# Patient Record
Sex: Female | Born: 1945 | Race: Black or African American | Hispanic: No | Marital: Single | Smoking: Never smoker
Health system: Southern US, Community
[De-identification: ages and names within clinical notes are randomized; demographics above are authoritative.]

## PROBLEM LIST (undated history)

## (undated) DIAGNOSIS — E119 Type 2 diabetes mellitus without complications: Secondary | ICD-10-CM

## (undated) DIAGNOSIS — I10 Essential (primary) hypertension: Secondary | ICD-10-CM

---

## 1997-06-09 ENCOUNTER — Other Ambulatory Visit: Admission: RE | Admit: 1997-06-09 | Discharge: 1997-06-09 | Payer: Self-pay | Admitting: Obstetrics & Gynecology

## 1998-06-29 ENCOUNTER — Other Ambulatory Visit: Admission: RE | Admit: 1998-06-29 | Discharge: 1998-06-29 | Payer: Self-pay | Admitting: Obstetrics & Gynecology

## 2002-04-05 ENCOUNTER — Other Ambulatory Visit: Admission: RE | Admit: 2002-04-05 | Discharge: 2002-04-05 | Payer: Self-pay | Admitting: Obstetrics & Gynecology

## 2002-05-20 ENCOUNTER — Ambulatory Visit (HOSPITAL_COMMUNITY): Admission: RE | Admit: 2002-05-20 | Discharge: 2002-05-20 | Payer: Self-pay | Admitting: Internal Medicine

## 2003-04-14 ENCOUNTER — Other Ambulatory Visit: Admission: RE | Admit: 2003-04-14 | Discharge: 2003-04-14 | Payer: Self-pay | Admitting: Obstetrics & Gynecology

## 2004-10-01 ENCOUNTER — Other Ambulatory Visit: Admission: RE | Admit: 2004-10-01 | Discharge: 2004-10-01 | Payer: Self-pay | Admitting: Obstetrics & Gynecology

## 2010-03-28 ENCOUNTER — Other Ambulatory Visit: Payer: Self-pay | Admitting: Obstetrics & Gynecology

## 2010-03-28 DIAGNOSIS — N6310 Unspecified lump in the right breast, unspecified quadrant: Secondary | ICD-10-CM

## 2010-04-03 ENCOUNTER — Ambulatory Visit
Admission: RE | Admit: 2010-04-03 | Discharge: 2010-04-03 | Disposition: A | Discharge status: Home / Self Care | Payer: BC Managed Care – PPO | Source: Ambulatory Visit | Attending: Obstetrics & Gynecology | Admitting: Obstetrics & Gynecology

## 2010-04-03 ENCOUNTER — Other Ambulatory Visit: Payer: Self-pay | Admitting: Obstetrics & Gynecology

## 2010-04-03 DIAGNOSIS — N6320 Unspecified lump in the left breast, unspecified quadrant: Secondary | ICD-10-CM

## 2010-04-03 DIAGNOSIS — N6310 Unspecified lump in the right breast, unspecified quadrant: Secondary | ICD-10-CM

## 2010-06-21 NOTE — Op Note (Signed)
[name], [name]                            ACCOUNT NO.:  [id number]   MEDICAL RECORD NO.:  [id number]                   PATIENT TYPE:  AMB   LOCATION:  DAY                                  FACILITY:   PHYSICIAN:  Lionel December, M.D.                 DATE OF BIRTH:  29-Mar-1945   DATE OF PROCEDURE:  05/20/2002  DATE OF DISCHARGE:                                 OPERATIVE REPORT   PROCEDURE:  Esophagogastroduodenoscopy followed by total colonoscopy.   INDICATION:  [name] is a 65 year old African-American female with a several  year history of symptoms of GERD which do respond to PPI.  She is undergoing  EGD to make sure she does not have any complications of chronic GERD.  She  will also undergo screening colonoscopy.  She is average risk for CRC.  Both  procedures were reviewed with the patient and informed consent was obtained.   PREOP MEDICATION:  1. Cetacaine spray for pharyngeal topical anesthesia.  2. Demerol 50 mg IV.  3. Versed 5 mg IV in divided dose.   INSTRUMENT:  Olympus video system.   FINDINGS:  Procedure performed in the endoscopy suite.  The patient's vital  signs and O2 sat were monitored during the procedure and remained stable.   1. ESOPHAGOGASTRODUODENOSCOPY:  The patient was placed in the left lateral  recumbent position and endoscope was passed via the oropharynx without any  difficulty into the esophagus.  Mucosa of the esophagus was normal  throughout.  Squamocolumnar junction was unremarkable.  No hernia was noted.   Stomach:  It was empty and distended very well on insufflation.  Folds in  the proximal stomach were normal.  Examination of the mucosa, gastric body,  antrum, pyloric channel, as well as annularis, fundus, cardia was normal.   Duodenum:  Examination of the bulb and post-bulbar duodenum was normal.  The endoscope was withdrawn and the patient was prepared for procedure #2.   2. TOTAL COLONOSCOPY:  Rectal examination performed.  No  abnormality noted  on the external or digital exam.  Scope was placed in the rectum and  advanced to the sigmoid colon and beyond.  Preparation was excellent.  Scope  was passed to the cecum which was identified by ileocecal valve and  appendiceal orifice.  Pictures were taken for the record.  As the scope was  withdrawn, colonic mucosa was carefully examined.  It was normal throughout.  There was a small or tiny polyp at rectum which was obliterated by cold  biopsy.  Rectal mucosa was normal.  Scope was retroflexed, examining  anorectal junction and hemorrhoids were noted below the dentate line.  Endoscope was straightened and withdrawn. The patient tolerated the  procedure well.   FINAL DIAGNOSES:  1. Normal esophagogastroduodenoscopy.  2. Small rectal polyp and external hemorrhoids; otherwise normal     colonoscopy. This polyp was obliterated by cold biopsy.  RECOMMENDATIONS:  1. She will continue antireflux measures and she can take Protonix daily on     demand.  P.r.n. use would be appropriate. She does not have any evidence     of Barrett's esophagus or endoscopic esophagitis.  2. I will contact the patient with the biopsies next week.                                                Lionel December, M.D.    NR/MEDQ  D:  05/20/2002  T:  05/20/2002  Job:  [id number]

## 2010-06-21 NOTE — H&P (Signed)
NAMECRISTIANA, [name]                          ACCOUNT NO.:  [id number]   MEDICAL RECORD NO.:  [id number]                  PATIENT TYPE:   LOCATION:                                       FACILITY:   PHYSICIAN:  Lionel December, M.D.                 DATE OF BIRTH:  09/21/1945   DATE OF ADMISSION:  04/25/2002  DATE OF DISCHARGE:                                HISTORY & PHYSICAL   PRESENTING COMPLAINT:  Recurrent symptoms of GERD.   HISTORY OF PRESENT ILLNESS:  The patient is a 65 year old African-American  female who has had symptoms of reflux esophagitis for at least five years.  She was initially seen in February 1999, and two months later again.  She  was begun on antireflux measures and Prevacid.  She did great for awhile.  She began to have breakthrough symptoms last year in spite of continuing  with antireflux measures.  She was seen by Ms. Wille Celeste, NP, at  [facility] about two weeks ago.  She was switched to Protonix 40 mg  q.m. was made.  She has noted significant improvement since she has been on  Protonix.  She is not having any heartburn, regurgitation, or pyrosis.  She  also denies dysphagia, nausea, vomiting, anorexia, or weight loss.  She has  a good appetite.  Review of systems negative for melena or rectal bleeding.  She has never been screened for colorectal carcinoma.   PAST MEDICAL HISTORY:  1. Hypertension for over 20 years.  2. Tubal ligation.  3. Skin grafting to her right index finger a few years ago.   ALLERGIES:  None known.   FAMILY HISTORY:  Mother has diabetes and hypertension.  Father was also  diabetic and hypertensive and died at age 57, about three years ago.  She  has seven sisters, and three are hypertensive.   SOCIAL HISTORY:  She is single and has two children.  She has been working  at a tobacco company for 27 years, which was initially [employer] and now is  [employer].  She does not smoke cigarettes or drink alcohol.   PHYSICAL EXAMINATION:  GENERAL:  Pleasant, well-developed, well-nourished  African-American female who is in no acute distress.  VITAL SIGNS:  She weighs 177 pounds.  She is 5 feet 8 inches tall.  Pulse 76  per minute, blood pressure 140/70.  HEENT:  Conjunctivae pink.  Sclerae nonicteric.  Oropharyngeal mucosa is  normal.  NECK:  Without masses or thyromegaly.  CARDIAC:  Regular rhythm.  Normal S1 and S2.  No murmur or gallop noted.  LUNGS:  Clear to auscultation.  ABDOMEN:  Full, soft, nontender.  Without organomegaly or masses.  RECTAL:  Deferred.  EXTREMITIES:  No clubbing or peripheral edema noted.   ASSESSMENT:  1. The patient is a 65 year old African-American female who has had symptoms     of reflux esophagitis for at  least five years.  It is curious to know to     know that she developed intolerance to Prevacid which worked quite well     for a number of years; however, she has had a good response to Protonix.     She appears to be adhering to antireflux measures fairly well.  She     actually has lost eight pounds since she was last in our office.  Since     she has chronic gastroesophageal reflux disease, upper gastrointestinal     tract needs to be evaluated to make sure she does not have Barrett's     esophagus.  2. She is average risk for colorectal carcinoma.  She has never been     screened for colorectal carcinoma.  It was reviewed with the patient, and     she is very much interested in also having her colon checked.   RECOMMENDATIONS:  She will continue antireflux measures as before.  She will  stay on Protonix at 40 mg p.o. q.m., prescription given for 30 with 11  refills.   She will undergo diagnostic esophagogastroduodenoscopy, followed by  screening colonoscopy.  I have reviewed the procedures with the patient, and  she is agreeable.   I would like to thank Ms. Wille Celeste and Dr. Nobie Putnam for the  opportunity to participate in the care of this nice  lady.                                               Lionel December, M.D.    NR/MEDQ  D:  04/25/2002  T:  04/25/2002  Job:  [id number]   cc:   Wille Celeste, N.P.   Patrica Duel, M.D.  [address]  [place]  [place] [address]  Fax: [phone]   [facility]

## 2012-05-24 ENCOUNTER — Telehealth: Payer: Self-pay

## 2012-05-24 NOTE — Telephone Encounter (Signed)
Pt was referred by Dr. Sherwood Gambler for screening colonoscopy. Her last one ( EGD/TCS) was 05/20/2002 with Dr. Karilyn Cota. She is not having problems now and has not decided to do colonoscopy. She is aware now that N[facility] is across from the hospital. She will decide what she wants to do, and give [place] a call.

## 2012-05-26 NOTE — Telephone Encounter (Signed)
Letter to PCP

## 2013-01-13 ENCOUNTER — Other Ambulatory Visit (HOSPITAL_COMMUNITY): Payer: Self-pay | Admitting: Family Medicine

## 2013-01-13 DIAGNOSIS — R7989 Other specified abnormal findings of blood chemistry: Secondary | ICD-10-CM

## 2013-01-19 ENCOUNTER — Ambulatory Visit (HOSPITAL_COMMUNITY)
Admission: RE | Admit: 2013-01-19 | Discharge: 2013-01-19 | Disposition: A | Discharge status: Home / Self Care | Payer: Medicare Other | Source: Ambulatory Visit | Attending: Family Medicine | Admitting: Family Medicine

## 2013-01-19 ENCOUNTER — Other Ambulatory Visit (HOSPITAL_COMMUNITY): Payer: Self-pay | Admitting: Family Medicine

## 2013-01-19 DIAGNOSIS — K7689 Other specified diseases of liver: Secondary | ICD-10-CM | POA: Insufficient documentation

## 2013-01-19 DIAGNOSIS — K8020 Calculus of gallbladder without cholecystitis without obstruction: Secondary | ICD-10-CM | POA: Insufficient documentation

## 2013-01-19 DIAGNOSIS — R7989 Other specified abnormal findings of blood chemistry: Secondary | ICD-10-CM | POA: Insufficient documentation

## 2013-08-01 ENCOUNTER — Emergency Department (HOSPITAL_COMMUNITY)
Admission: EM | Admit: 2013-08-01 | Discharge: 2013-08-02 | Disposition: A | Discharge status: Home / Self Care | Payer: Medicare Other | Attending: Emergency Medicine | Admitting: Emergency Medicine

## 2013-08-01 ENCOUNTER — Emergency Department (HOSPITAL_COMMUNITY): Payer: Medicare Other

## 2013-08-01 ENCOUNTER — Encounter (HOSPITAL_COMMUNITY): Payer: Self-pay | Admitting: Emergency Medicine

## 2013-08-01 DIAGNOSIS — E119 Type 2 diabetes mellitus without complications: Secondary | ICD-10-CM | POA: Insufficient documentation

## 2013-08-01 DIAGNOSIS — Y92009 Unspecified place in unspecified non-institutional (private) residence as the place of occurrence of the external cause: Secondary | ICD-10-CM | POA: Insufficient documentation

## 2013-08-01 DIAGNOSIS — T675XXA Heat exhaustion, unspecified, initial encounter: Secondary | ICD-10-CM | POA: Insufficient documentation

## 2013-08-01 DIAGNOSIS — Y9389 Activity, other specified: Secondary | ICD-10-CM | POA: Insufficient documentation

## 2013-08-01 DIAGNOSIS — R55 Syncope and collapse: Secondary | ICD-10-CM

## 2013-08-01 DIAGNOSIS — E860 Dehydration: Secondary | ICD-10-CM | POA: Insufficient documentation

## 2013-08-01 DIAGNOSIS — I10 Essential (primary) hypertension: Secondary | ICD-10-CM | POA: Insufficient documentation

## 2013-08-01 DIAGNOSIS — X30XXXA Exposure to excessive natural heat, initial encounter: Secondary | ICD-10-CM | POA: Insufficient documentation

## 2013-08-01 DIAGNOSIS — Z79899 Other long term (current) drug therapy: Secondary | ICD-10-CM | POA: Insufficient documentation

## 2013-08-01 HISTORY — DX: Essential (primary) hypertension: I10

## 2013-08-01 HISTORY — DX: Type 2 diabetes mellitus without complications: E11.9

## 2013-08-01 LAB — BASIC METABOLIC PANEL
BUN: 14 mg/dL (ref 6–23)
CO2: 28 meq/L (ref 19–32)
CREATININE: 1.24 mg/dL — AB (ref 0.50–1.10)
Calcium: 9.9 mg/dL (ref 8.4–10.5)
Chloride: 100 mEq/L (ref 96–112)
GFR calc non Af Amer: 44 mL/min — ABNORMAL LOW (ref >90)
GFR, EST AFRICAN AMERICAN: 51 mL/min — AB (ref >90)
Glucose, Bld: 157 mg/dL — ABNORMAL HIGH (ref 70–99)
Potassium: 3.8 mEq/L (ref 3.7–5.3)
Sodium: 143 mEq/L (ref 137–147)

## 2013-08-01 LAB — I-STAT TROPONIN, ED: TROPONIN I, POC: 0 ng/mL (ref 0.00–0.08)

## 2013-08-01 LAB — CBC
HCT: 37.9 % (ref 36.0–46.0)
Hemoglobin: 12.8 g/dL (ref 12.0–15.0)
MCH: 28.3 pg (ref 26.0–34.0)
MCHC: 33.8 g/dL (ref 30.0–36.0)
MCV: 83.7 fL (ref 78.0–100.0)
PLATELETS: 348 10*3/uL (ref 150–400)
RBC: 4.53 MIL/uL (ref 3.87–5.11)
RDW: 14.2 % (ref 11.5–15.5)
WBC: 12.6 10*3/uL — ABNORMAL HIGH (ref 4.0–10.5)

## 2013-08-01 NOTE — ED Notes (Signed)
Patient is actively vomiting in triage. °

## 2013-08-01 NOTE — ED Notes (Signed)
Patient was mowing yard and was stung by a yellow jacket on left calf and right hand.  No hives or swelling noted to any of the areas.  Patient states she had a syncopal episode after being stung and vomited x1.

## 2013-08-01 NOTE — Discharge Instructions (Signed)
Syncope °Syncope is a medical term for fainting or passing out. This means you lose consciousness and drop to the ground. People are generally unconscious for less than 5 minutes. You may have some muscle twitches for up to 15 seconds before waking up and returning to normal. Syncope occurs more often in older adults, but it can happen to anyone. While most causes of syncope are not dangerous, syncope can be a sign of a serious medical problem. It is important to seek medical care.  °CAUSES  °Syncope is caused by a sudden drop in blood flow to the brain. The specific cause is often not determined. Factors that can bring on syncope include: °· Taking medicines that lower blood pressure. °· Sudden changes in posture, such as standing up quickly. °· Taking more medicine than prescribed. °· Standing in one place for too long. °· Seizure disorders. °· Dehydration and excessive exposure to heat. °· Low blood sugar (hypoglycemia). °· Straining to have a bowel movement. °· Heart disease, irregular heartbeat, or other circulatory problems. °· Fear, emotional distress, seeing blood, or severe pain. °SYMPTOMS  °Right before fainting, you may: °· Feel dizzy or light-headed. °· Feel nauseous. °· See all white or all black in your field of vision. °· Have cold, clammy skin. °DIAGNOSIS  °Your health care provider will ask about your symptoms, perform a physical exam, and perform an electrocardiogram (ECG) to record the electrical activity of your heart. Your health care provider may also perform other heart or blood tests to determine the cause of your syncope which may include: °· Transthoracic echocardiogram (TTE). During echocardiography, sound waves are used to evaluate how blood flows through your heart. °· Transesophageal echocardiogram (TEE). °· Cardiac monitoring. This allows your health care provider to monitor your heart rate and rhythm in real time. °· Holter monitor. This is a portable device that records your  heartbeat and can help diagnose heart arrhythmias. It allows your health care provider to track your heart activity for several days, if needed. °· Stress tests by exercise or by giving medicine that makes the heart beat faster. °TREATMENT  °In most cases, no treatment is needed. Depending on the cause of your syncope, your health care provider may recommend changing or stopping some of your medicines. °HOME CARE INSTRUCTIONS °· Have someone stay with you until you feel stable. °· Do not drive, use machinery, or play sports until your health care provider says it is okay. °· Keep all follow-up appointments as directed by your health care provider. °· Lie down right away if you start feeling like you might faint. Breathe deeply and steadily. Wait until all the symptoms have passed. °· Drink enough fluids to keep your urine clear or pale yellow. °· If you are taking blood pressure or heart medicine, get up slowly and take several minutes to sit and then stand. This can reduce dizziness. °SEEK IMMEDIATE MEDICAL CARE IF:  °· You have a severe headache. °· You have unusual pain in the chest, abdomen, or back. °· You are bleeding from your mouth or rectum, or you have black or tarry stool. °· You have an irregular or very fast heartbeat. °· You have pain with breathing. °· You have repeated fainting or seizure-like jerking during an episode. °· You faint when sitting or lying down. °· You have confusion. °· You have trouble walking. °· You have severe weakness. °· You have vision problems. °If you fainted, call your local emergency services (911 in [place] Do not drive   yourself to the hospital.  °MAKE SURE YOU: °· Understand these instructions. °· Will watch your condition. °· Will get help right away if you are not doing well or get worse. °Document Released: 01/20/2005 Document Revised: 01/25/2013 Document Reviewed: 03/21/2011 °ExitCare® Patient Information ©2015 [employer]. This information is not intended to replace  advice given to you by your health care provider. Make sure you discuss any questions you have with your health care provider. ° °

## 2013-08-01 NOTE — ED Provider Notes (Signed)
CSN: [id number]     Arrival date & time 08/01/13  2114 History   First MD Initiated Contact with Patient 08/01/13 2317     Chief Complaint  Patient presents with  . Insect Bite  . Loss of Consciousness     (Consider location/radiation/quality/duration/timing/severity/associated sxs/prior Treatment) HPI  This is a 68 year old female with history of diabetes and hypertension who presents following a syncopal episode. Patient states that she was mowing her lawn earlier this evening. She was stung in the right hand and the left foot by yellow jackets. She continued to move her lawn. Upon returning to her house, patient began to feel flushed, sat down, and "passed out." She did not hit her. She denies any shortness of breath or chest pain during this time. Reports one episode of emesis.  She denies any difficulty breathing. Patient has no known allergies.  Patient reports that it was hot outside and she "only ate 4 crackers earlier today." Blood glucose was 160.  Patient currently states that she feels well and is her baseline.  Past Medical History  Diagnosis Date  . Diabetes mellitus without complication   . Hypertension    History reviewed. No pertinent past surgical history. No family history on file. History  Substance Use Topics  . Smoking status: Never Smoker   . Smokeless tobacco: Not on file  . Alcohol Use: No   OB History   Grav Para Term Preterm Abortions TAB SAB Ect Mult Living                 Review of Systems  Constitutional: Negative for fever.  Respiratory: Negative for cough, chest tightness and shortness of breath.   Cardiovascular: Negative for chest pain.  Gastrointestinal: Negative for nausea, vomiting and abdominal pain.  Genitourinary: Negative for dysuria.  Musculoskeletal: Negative for back pain.  Skin: Negative for wound.  Neurological: Positive for syncope and light-headedness. Negative for headaches.  Psychiatric/Behavioral: Negative for confusion.   All other systems reviewed and are negative.     Allergies  Review of patient's allergies indicates no known allergies.  Home Medications   Prior to Admission medications   Medication Sig Start Date End Date Taking? Authorizing Provider  amLODipine (NORVASC) 10 MG tablet Take 20 mg by mouth daily.   Yes Historical Provider, MD  benazepril (LOTENSIN) 10 MG tablet Take 10 mg by mouth daily.   Yes Historical Provider, MD  hydrochlorothiazide (HYDRODIURIL) 25 MG tablet Take 25 mg by mouth daily.   Yes Historical Provider, MD   BP 127/71  Pulse 76  Temp(Src) 97.8 F (36.6 C) (Oral)  Resp 15  Ht 5\' 8"  (1.727 m)  Wt 172 lb (78.019 kg)  BMI 26.16 kg/m2  SpO2 98% Physical Exam  Nursing note and vitals reviewed. Constitutional: She is oriented to person, place, and time. She appears well-developed and well-nourished. No distress.  HENT:  Head: Normocephalic and atraumatic.  Mouth/Throat: Oropharynx is clear and moist.  Eyes: Pupils are equal, round, and reactive to light.  Neck: Neck supple.  Cardiovascular: Normal rate, regular rhythm and normal heart sounds.   No murmur heard. Pulmonary/Chest: Effort normal and breath sounds normal. No respiratory distress. She has no wheezes.  Abdominal: Soft. There is no tenderness.  Musculoskeletal: She exhibits no edema.  Neurological: She is alert and oriented to person, place, and time.  Skin: Skin is warm and dry.  No evidence of wound or insect bite  Psychiatric: She has a normal mood and affect.  ED Course  Procedures (including critical care time) Labs Review Labs Reviewed  CBC - Abnormal; Notable for the following:    WBC 12.6 (*)    All other components within normal limits  BASIC METABOLIC PANEL - Abnormal; Notable for the following:    Glucose, Bld 157 (*)    Creatinine, Ser 1.24 (*)    GFR calc non Af Amer 44 (*)    GFR calc Af Amer 51 (*)    All other components within normal limits  Rosezena SensorI-STAT TROPOININ, ED     Imaging Review Dg Chest 2 View  08/01/2013   CLINICAL DATA:  Stone by bee.  Passed out.  EXAM: CHEST  2 VIEW  COMPARISON:  None.  FINDINGS: Normal heart size and mediastinal contours. Fine interstitial opacities in the lower lungs. No edema, consolidation, effusion, or pneumothorax.  IMPRESSION: Mild bibasilar atelectasis or scar.   Electronically Signed   By: Tiburcio PeaJonathan  Watts M.D.   On: 08/01/2013 22:57     EKG Interpretation   Date/Time:  Monday August 01 2013 21:36:28 EDT Ventricular Rate:  78 PR Interval:  198 QRS Duration: 76 QT Interval:  374 QTC Calculation: 426 R Axis:   -63 Text Interpretation:  Normal sinus rhythm Possible Left atrial enlargement  Left axis deviation Inferior infarct , age undetermined Anterior infarct ,  age undetermined Abnormal ECG No prior for comparison Confirmed by Candiss Galeana   MD, Toni AmendOURTNEY ([id number]) on 08/01/2013 11:48:10 PM      MDM   Final diagnoses:  Syncope, unspecified syncope type  Heat exhaustion, initial encounter  Dehydration   Patient presents with syncope following mowing her lawn and being stung by a yellow jacket. She is at her baseline. Vital signs are reassuring. No evidence of statuses. Patient denies any chest pain or shortness of breath. Lab work from triage reviewed. She does have a creatinine of 1.24 with an unknown baseline.  This may represent mild dehydration. Patient is not orthostatic and she is able to tolerate liquids by mouth. We'll allow her to oral he hydrated. EKG is reassuring. Suspect patient's episode of syncope likely secondary to heat exhaustion and/or dehydration. No evidence of allergic reaction. Discuss with patient workup and findings.  Patient advised to aggressively hydrate at home and avoid outdoor activities during the hot parts of the day.  Patient stated understanding.  After history, exam, and medical workup I feel the patient has been appropriately medically screened and is safe for discharge home. Pertinent  diagnoses were discussed with the patient. Patient was given return precautions.     Shon Batonourtney F Brindy Higginbotham, MD 08/01/13 [id number]

## 2013-08-01 NOTE — ED Notes (Signed)
Pt placed on monitor upon returning from radiology.

## 2014-07-12 ENCOUNTER — Other Ambulatory Visit (HOSPITAL_COMMUNITY): Payer: Self-pay | Admitting: Family Medicine

## 2014-07-12 DIAGNOSIS — Z1231 Encounter for screening mammogram for malignant neoplasm of breast: Secondary | ICD-10-CM

## 2014-07-21 ENCOUNTER — Ambulatory Visit (HOSPITAL_COMMUNITY)
Admission: RE | Admit: 2014-07-21 | Discharge: 2014-07-21 | Disposition: A | Discharge status: Home / Self Care | Payer: Medicare Other | Source: Ambulatory Visit | Attending: Family Medicine | Admitting: Family Medicine

## 2014-07-21 DIAGNOSIS — Z1231 Encounter for screening mammogram for malignant neoplasm of breast: Secondary | ICD-10-CM | POA: Diagnosis present

## 2014-10-27 IMAGING — US US ABDOMEN COMPLETE
1 series · 14 of 25 positions shown · non-contrast
Comparison: None.

CLINICAL DATA: Abnormal LFTs.

EXAM:
ULTRASOUND ABDOMEN COMPLETE

[Series 1: us abdomen complete · 0.23mm/px · 14 of 92 slices shown]
[im 1/92]
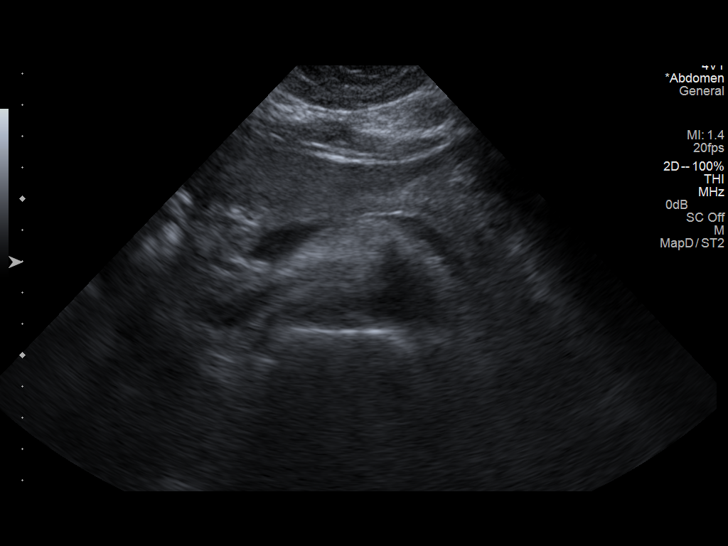
[im 8/92]
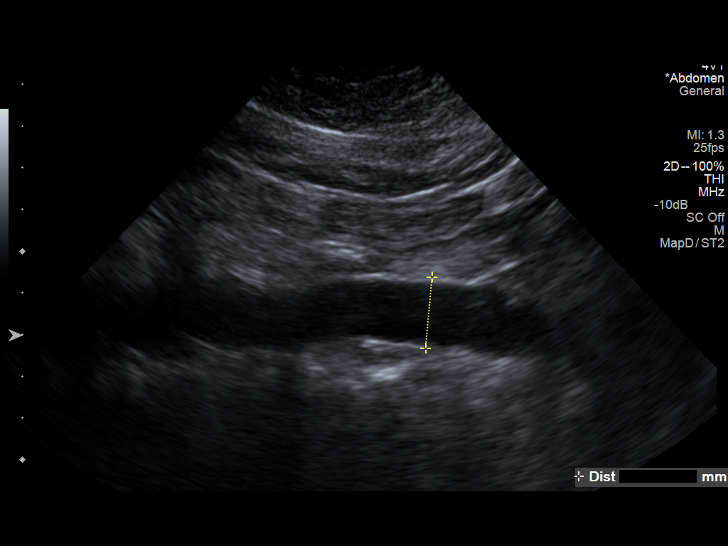
[im 16/92]
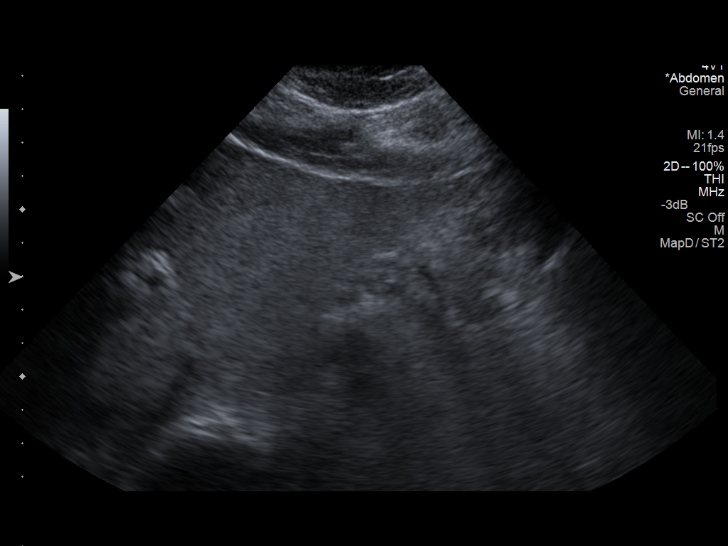
[im 23/92]
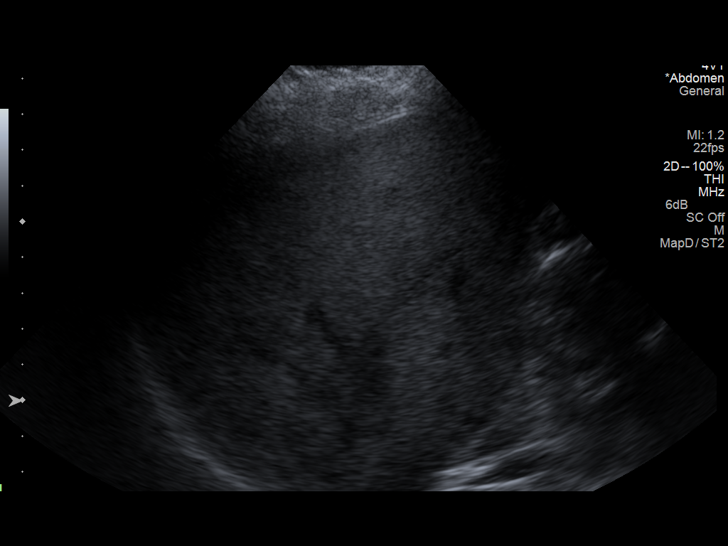
[im 31/92]
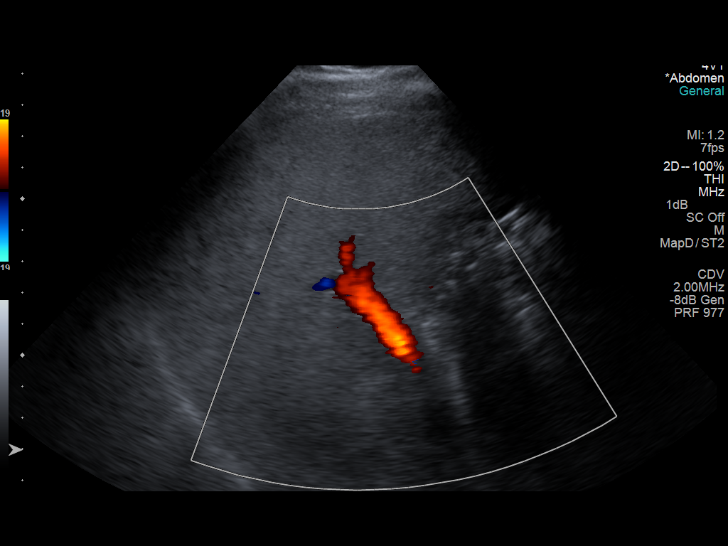
[im 35/92]
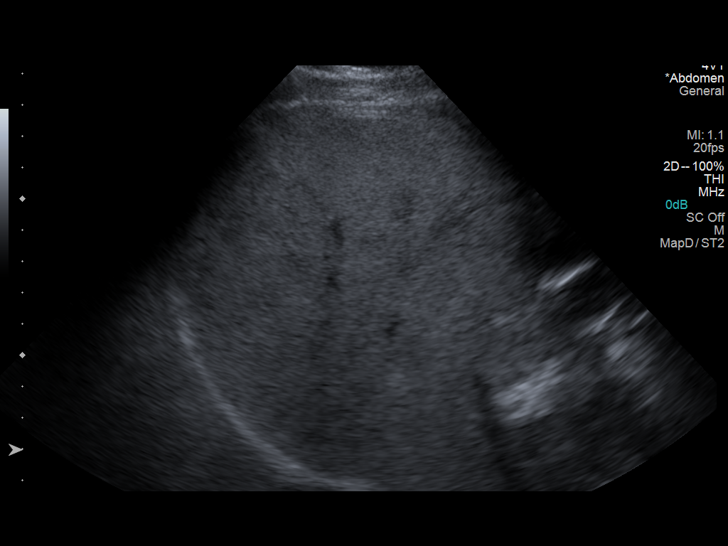
[im 42/92]
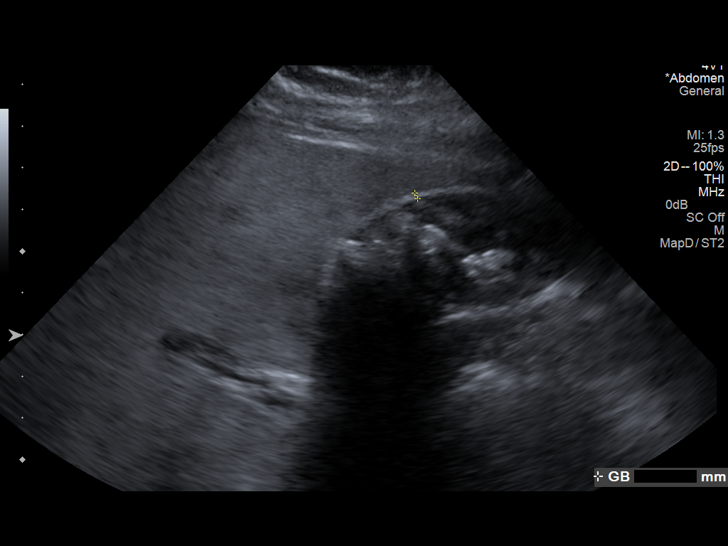
[im 50/92]
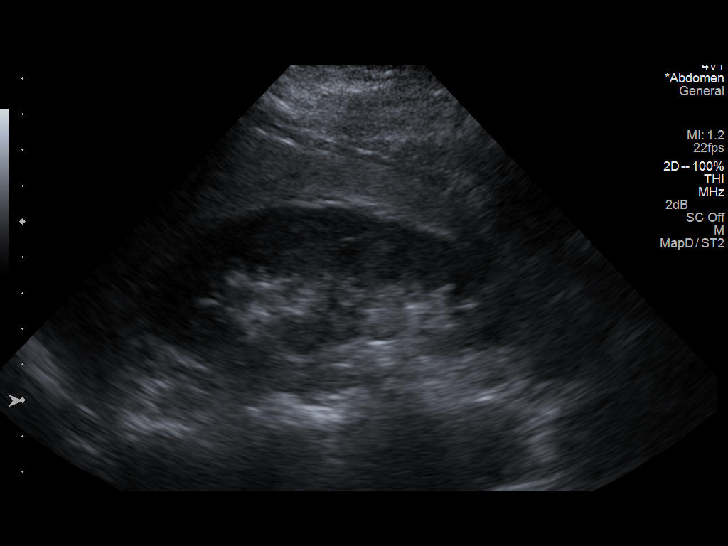
[im 57/92]
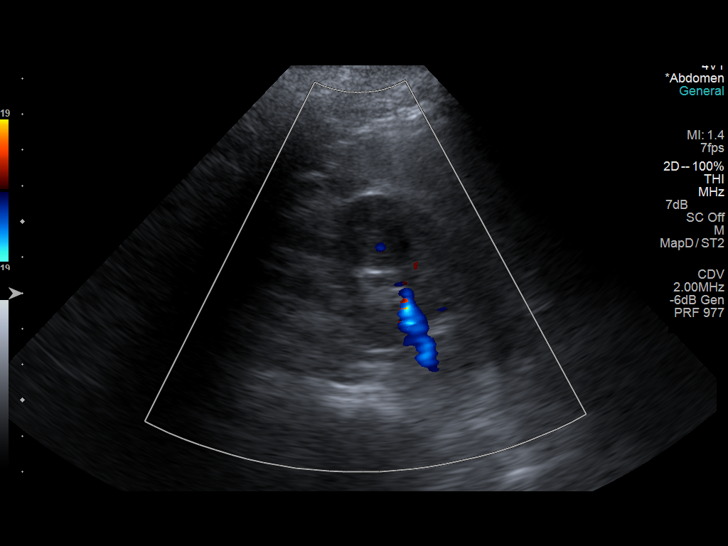
[im 61/92]
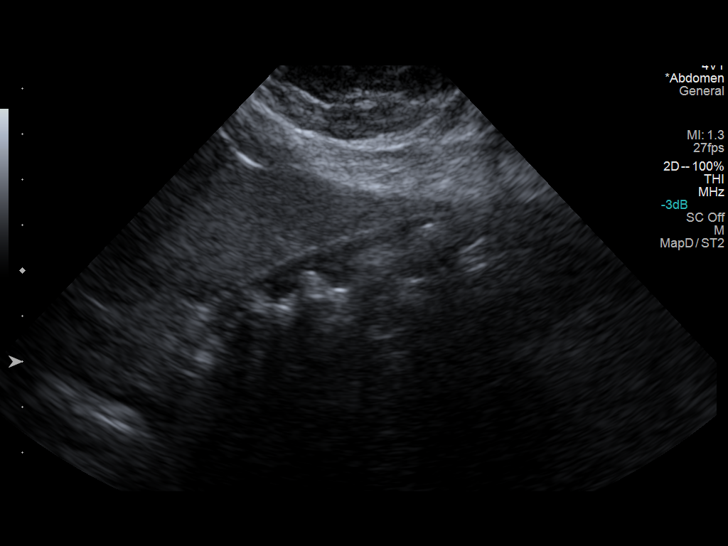
[im 69/92]
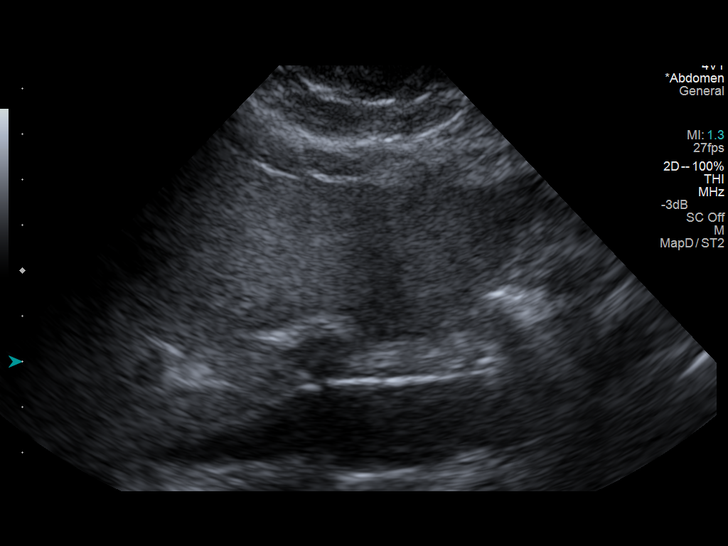
[im 76/92]
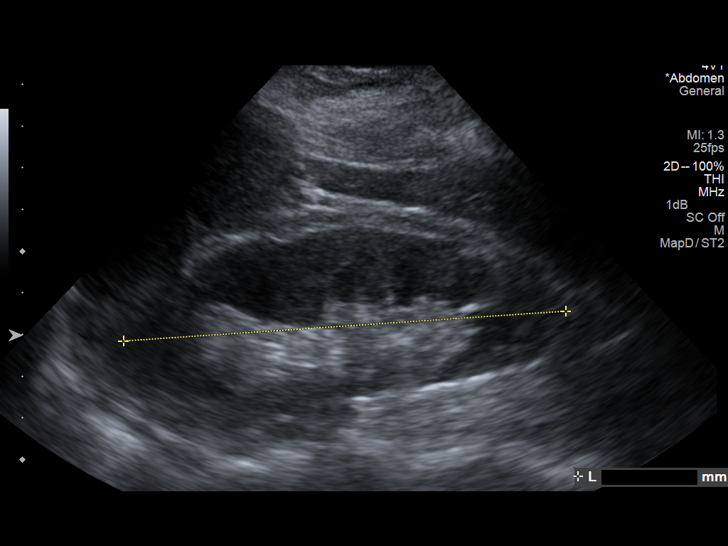
[im 84/92]
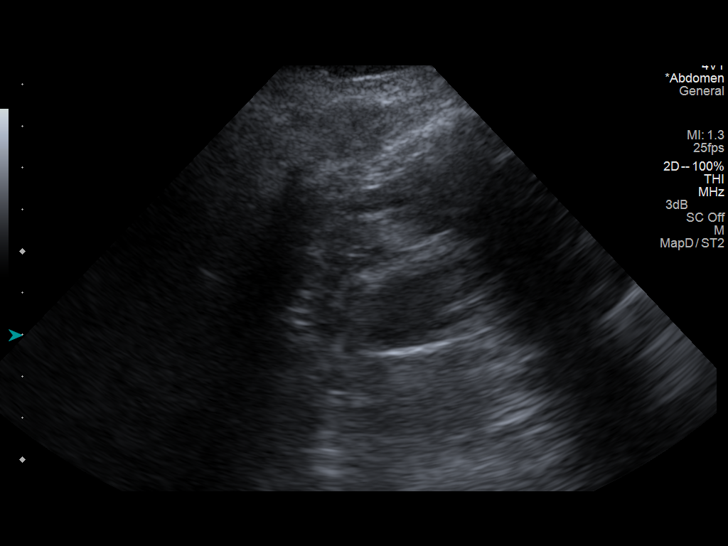
[im 92/92]
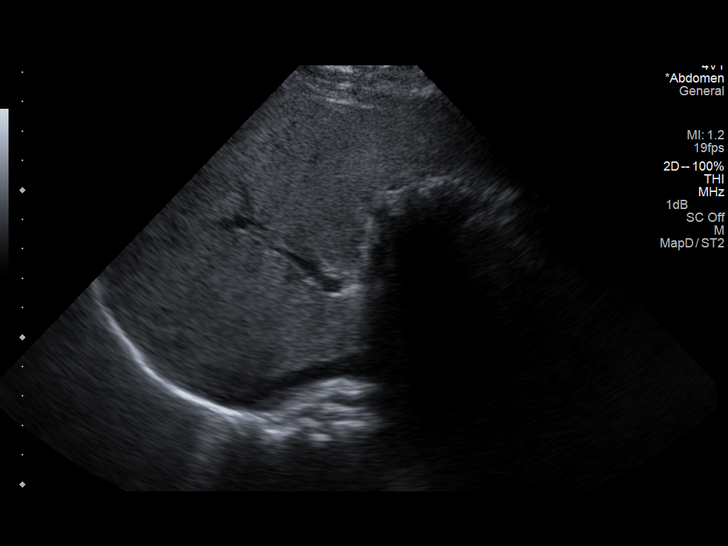

[14 of 25 positions shown; findings below may reference images not displayed]

FINDINGS: Gallbladder:

Multiple gallstones. Gallbladder wall thickness 1.5 mm. Negative
Murphy sign. No pericholecystic fluid collections.

Common bile duct:

Diameter: 4.1 mm.

Liver:

Diffuse increased echogenicity consistent with diffuse fatty
infiltration noted. There is a small area normal echotexture in the
medial portion of the right lobe liver most likely focal fatty
sparing.

IVC:

No abnormality visualized.

Pancreas:

Visualized portion unremarkable.

Spleen:

Size and appearance within normal limits.

Right Kidney:

Length: 10.8 cm. Echogenicity within normal limits. No mass or
hydronephrosis visualized.

Left Kidney:

Length: 10.6 cm. Echogenicity within normal limits. No mass or
hydronephrosis visualized.

Abdominal aorta:

No aneurysm visualized.

Other findings:

None.
IMPRESSION: 1. Multiple gallstones. No evidence of cholecystitis. No biliary
obstruction.
2. Fatty infiltration of the liver with small region of focal fatty
sparing.

## 2017-08-04 ENCOUNTER — Ambulatory Visit (HOSPITAL_COMMUNITY): Payer: PRIVATE HEALTH INSURANCE

## 2017-08-04 ENCOUNTER — Other Ambulatory Visit (HOSPITAL_COMMUNITY): Payer: Self-pay | Admitting: Family Medicine

## 2017-08-04 DIAGNOSIS — Z1231 Encounter for screening mammogram for malignant neoplasm of breast: Secondary | ICD-10-CM

## 2017-08-20 ENCOUNTER — Encounter (HOSPITAL_COMMUNITY): Payer: Self-pay

## 2017-08-20 ENCOUNTER — Ambulatory Visit (HOSPITAL_COMMUNITY)
Admission: RE | Admit: 2017-08-20 | Discharge: 2017-08-20 | Disposition: A | Discharge status: Home / Self Care | Payer: Medicare Other | Source: Ambulatory Visit | Attending: Family Medicine | Admitting: Family Medicine

## 2017-08-20 DIAGNOSIS — Z1231 Encounter for screening mammogram for malignant neoplasm of breast: Secondary | ICD-10-CM | POA: Insufficient documentation

## 2018-10-27 ENCOUNTER — Other Ambulatory Visit (HOSPITAL_COMMUNITY): Payer: Self-pay | Admitting: Family Medicine

## 2018-10-27 DIAGNOSIS — Z1231 Encounter for screening mammogram for malignant neoplasm of breast: Secondary | ICD-10-CM

## 2021-12-03 ENCOUNTER — Other Ambulatory Visit: Payer: Self-pay

## 2021-12-03 ENCOUNTER — Emergency Department (HOSPITAL_COMMUNITY): Payer: Medicare Other

## 2021-12-03 ENCOUNTER — Encounter (HOSPITAL_COMMUNITY): Payer: Self-pay | Admitting: *Deleted

## 2021-12-03 ENCOUNTER — Emergency Department (HOSPITAL_COMMUNITY)
Admission: EM | Admit: 2021-12-03 | Discharge: 2021-12-03 | Disposition: A | Discharge status: Home / Self Care | Payer: Medicare Other | Attending: Emergency Medicine | Admitting: Emergency Medicine

## 2021-12-03 DIAGNOSIS — W268XXA Contact with other sharp object(s), not elsewhere classified, initial encounter: Secondary | ICD-10-CM | POA: Insufficient documentation

## 2021-12-03 DIAGNOSIS — E119 Type 2 diabetes mellitus without complications: Secondary | ICD-10-CM | POA: Diagnosis not present

## 2021-12-03 DIAGNOSIS — S61217A Laceration without foreign body of left little finger without damage to nail, initial encounter: Secondary | ICD-10-CM | POA: Diagnosis present

## 2021-12-03 DIAGNOSIS — I10 Essential (primary) hypertension: Secondary | ICD-10-CM | POA: Diagnosis not present

## 2021-12-03 DIAGNOSIS — Z79899 Other long term (current) drug therapy: Secondary | ICD-10-CM | POA: Insufficient documentation

## 2021-12-03 DIAGNOSIS — Y92000 Kitchen of unspecified non-institutional (private) residence as  the place of occurrence of the external cause: Secondary | ICD-10-CM | POA: Insufficient documentation

## 2021-12-03 MED ORDER — BUPIVACAINE HCL (PF) 0.25 % IJ SOLN
10.0000 mL | Freq: Once | INTRAMUSCULAR | Status: DC
  Filled 2021-12-03: qty 30

## 2021-12-03 MED ORDER — POVIDONE-IODINE 10 % EX SOLN
CUTANEOUS | Status: AC
  Filled 2021-12-03: qty 14.8

## 2021-12-03 NOTE — ED Provider Notes (Signed)
[name] Provider Note   CSN: [id number] Arrival date & time: 12/03/21  1038     History  Chief Complaint  Patient presents with   Laceration    [name] is a 76 y.o. female.   Laceration   76 year old female presents emergency department with complaints of laceration.  Patient states that she was cleaning in the kitchen yesterday around 8 to 9 PM with a Brillo pad when one of the stray metal strands cut her left pinky.  Bleeding or with direct pressure.  She is on urgent care earlier today patient emergency department for further evaluation.  Patient complaining of no motor or sensory deficits in affected pinky.  Again, bleeding controlled with direct pressure.  Patient washed out wound after laceration occurred.  She presents emergency department for closure of wound.  Tetanus updated in urgent care.  Past medical history significant for hypertension, diabetes mellitus  Home Medications Prior to Admission medications   Medication Sig Start Date End Date Taking? Authorizing Provider  amLODipine (NORVASC) 10 MG tablet Take 20 mg by mouth daily.    [provider]  benazepril (LOTENSIN) 10 MG tablet Take 10 mg by mouth daily.    [provider]  hydrochlorothiazide (HYDRODIURIL) 25 MG tablet Take 25 mg by mouth daily.    [provider]      Allergies    Patient has no known allergies.    Review of Systems   Review of Systems  All other systems reviewed and are negative.   Physical Exam Updated Vital Signs BP (!) 168/87   Pulse 88   Temp 98 F (36.7 C) (Oral)   Resp 18   Ht 5\' 6"  (1.676 m)   Wt 69.9 kg   SpO2 98%   BMI 24.86 kg/m  Physical Exam Vitals and nursing note reviewed.  Constitutional:      General: She is not in acute distress.    Appearance: She is well-developed.  HENT:     Head: Normocephalic and atraumatic.  Eyes:     Conjunctiva/sclera: Conjunctivae normal.  Cardiovascular:     Rate and  Rhythm: Normal rate and regular rhythm.     Heart sounds: No murmur heard. Pulmonary:     Effort: Pulmonary effort is normal. No respiratory distress.     Breath sounds: Normal breath sounds.  Abdominal:     Palpations: Abdomen is soft.     Tenderness: There is no abdominal tenderness.  Musculoskeletal:        General: No swelling.     Cervical back: Neck supple.  Skin:    General: Skin is warm and dry.     Capillary Refill: Capillary refill takes less than 2 seconds.     Comments: 1.5 cm laceration noted on the palmar aspect of patient's left fifth finger at PIP.  Patient has full flexion and extension of affected finger.  No obvious ligamentous involvement.  No obvious foreign body.  Bleeding controlled with direct pressure.  Neurological:     Mental Status: She is alert.  Psychiatric:        Mood and Affect: Mood normal.     ED Results / Procedures / Treatments   Labs (all labs ordered are listed, but only abnormal results are displayed) Labs Reviewed - No data to display  EKG None  Radiology DG Finger Little Left  Result Date: 12/03/2021 CLINICAL DATA:  Trauma, skin laceration EXAM: LEFT LITTLE FINGER 2+V COMPARISON:  None Available. FINDINGS: No  fracture or dislocation is seen. There are no opaque foreign bodies. IMPRESSION: No fracture or dislocation is seen in left fifth finger. Electronically Signed   By: Ernie Avena M.D.   On: 12/03/2021 11:34    Procedures .Marland KitchenLaceration Repair  Date/Time: 12/03/2021 11:25 AM  Performed by: Peter Garter, PA Authorized by: Peter Garter, PA   Consent:    Consent obtained:  Verbal   Consent given by:  Patient   Risks, benefits, and alternatives were discussed: yes     Risks discussed:  Infection, need for additional repair, nerve damage, poor wound healing, poor cosmetic result, pain, retained foreign body, tendon damage and vascular damage   Alternatives discussed:  No treatment, delayed treatment, observation  and referral Universal protocol:    Procedure explained and questions answered to patient or proxy's satisfaction: yes     Patient identity confirmed:  Verbally with patient Anesthesia:    Anesthesia method:  Nerve block   Block location:  Left pink ring block   Block needle gauge:  25 G   Block anesthetic:  Bupivacaine 0.25% w/o epi   Block injection procedure:  Anatomic landmarks identified, introduced needle, incremental injection, negative aspiration for blood and anatomic landmarks palpated   Block outcome:  Anesthesia achieved Laceration details:    Location:  Finger   Finger location:  L small finger   Length (cm):  2 Pre-procedure details:    Preparation:  Patient was prepped and draped in usual sterile fashion and imaging obtained to evaluate for foreign bodies Exploration:    Limited defect created (wound extended): no     Hemostasis achieved with:  Direct pressure   Imaging obtained: x-ray     Imaging outcome: foreign body not noted     Wound exploration: wound explored through full range of motion     Contaminated: no   Treatment:    Area cleansed with:  Saline and povidone-iodine   Amount of cleaning:  Standard   Irrigation solution:  Sterile saline   Irrigation volume:  250   Irrigation method:  Syringe   Visualized foreign bodies/material removed: no     Debridement:  None   Undermining:  None   Scar revision: no   Skin repair:    Repair method:  Sutures   Suture size:  4-0   Suture material:  Nylon   Number of sutures:  3 Approximation:    Approximation:  Close Repair type:    Repair type:  Simple Post-procedure details:    Dressing:  Bulky dressing and splint for protection   Procedure completion:  Tolerated well, no immediate complications     Medications Ordered in ED Medications  bupivacaine (PF) (MARCAINE) 0.25 % injection 10 mL (has no administration in time range)  povidone-iodine (BETADINE) 10 % external solution (has no administration in  time range)    ED Course/ Medical Decision Making/ A&P                           Medical Decision Making Amount and/or Complexity of Data Reviewed Radiology: ordered.  Risk Prescription drug management.   This patient presents to the ED for concern of laceration, this involves an extensive number of treatment options, and is a complaint that carries with it a high risk of complications and morbidity.  The differential diagnosis includes ligamentous/tendinous involvement, foreign body retention, fracture, strain/sprain, dislocation   Co morbidities that complicate the patient evaluation  See HPI  Additional history obtained:  Additional history obtained from EMR External records from outside source obtained and reviewed including hospital records   Lab Tests:  N/a   Imaging Studies ordered:  I ordered imaging studies including left pinky I independently visualized and interpreted imaging which showed no fracture or dislocation I agree with the radiologist interpretation   Cardiac Monitoring: / EKG:  The patient was maintained on a cardiac monitor.  I personally viewed and interpreted the cardiac monitored which showed an underlying rhythm of: Sinus rhythm   Consultations Obtained:  N/a   Problem List / ED Course / Critical interventions / Medication management  Laceration I ordered medication including lidocaine for digital block   Reevaluation of the patient after these medicines showed that the patient improved I have reviewed the patients home medicines and have made adjustments as needed   Social Determinants of Health:  Denies tobacco, illicit drug use   Test / Admission - Considered:  Laceration Vitals signs within normal range and stable throughout visit. Imaging studies significant for: see above Patient's laceration repaired in manner as indicated above.  Patient recommended suture removal in 10 to 14 days.  Patient educated regarding  proper wound care.  Treatment plan discussed with patient and she acknowledged understand was agreeable to said plan. Worrisome signs and symptoms were discussed with the patient, and the patient acknowledged understanding to return to the ED if noticed. Patient was stable upon discharge.          Final Clinical Impression(s) / ED Diagnoses Final diagnoses:  Laceration of left little finger without foreign body without damage to nail, initial encounter    Rx / DC Orders ED Discharge Orders     None         Wilnette Kales, [place] 12/03/21 1305    Isla Pence, MD 12/03/21 1350

## 2021-12-03 NOTE — ED Triage Notes (Signed)
Pt states she cut her left pinky finger on a brillo steel yesterday and she went to urgent care today who told her to come to ED to "check the vein"; finger is wrapper with coban from urgent care  Pt received tetanus injection from urgent care

## 2021-12-03 NOTE — Discharge Instructions (Addendum)
Follow-up in 10 to 14 days for suture removal here, PCP or urgent care.  Change bandages once daily.  Wash area with warm soapy water.  Avoid submersion in bodies of water.  Please not hesitate to return to emergency department for worrisome signs symptoms we discussed become apparent.

## 2024-01-15 ENCOUNTER — Other Ambulatory Visit (HOSPITAL_COMMUNITY): Payer: Self-pay | Admitting: Adult Health

## 2024-01-15 DIAGNOSIS — R1011 Right upper quadrant pain: Secondary | ICD-10-CM

## 2024-01-19 ENCOUNTER — Ambulatory Visit (HOSPITAL_COMMUNITY): Admission: RE | Admit: 2024-01-19 | Discharge: 2024-01-19 | Attending: Adult Health

## 2024-01-19 DIAGNOSIS — R1011 Right upper quadrant pain: Secondary | ICD-10-CM | POA: Diagnosis present
